# Patient Record
Sex: Male | Born: 2000 | Race: White | Hispanic: No | Marital: Single | Smoking: Never smoker
Health system: Southern US, Community
[De-identification: ages and names within clinical notes are randomized; demographics above are authoritative.]

## PROBLEM LIST (undated history)

## (undated) DIAGNOSIS — T7840XA Allergy, unspecified, initial encounter: Secondary | ICD-10-CM

## (undated) HISTORY — DX: Allergy, unspecified, initial encounter: T78.40XA

---
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
## 2021-04-22 ENCOUNTER — Other Ambulatory Visit: Payer: Self-pay

## 2021-04-22 ENCOUNTER — Ambulatory Visit
Admission: RE | Admit: 2021-04-22 | Discharge: 2021-04-22 | Disposition: A | Discharge status: Home / Self Care | Payer: Medicaid Other | Source: Ambulatory Visit | Attending: Emergency Medicine | Admitting: Emergency Medicine

## 2021-04-22 VITALS — BP 120/78 | HR 81 | Temp 98.7°F | Resp 17

## 2021-04-22 DIAGNOSIS — R6889 Other general symptoms and signs: Secondary | ICD-10-CM | POA: Diagnosis not present

## 2021-04-22 DIAGNOSIS — J111 Influenza due to unidentified influenza virus with other respiratory manifestations: Secondary | ICD-10-CM | POA: Diagnosis not present

## 2021-04-22 MED ORDER — FLUTICASONE PROPIONATE 50 MCG/ACT NA SUSP: 2.0000 | Freq: Every day | NASAL | 0 refills | Status: AC

## 2021-04-22 MED ORDER — BENZONATATE 100 MG PO CAPS: 100.0000 mg | ORAL_CAPSULE | Freq: Three times a day (TID) | ORAL | 0 refills | Status: AC

## 2021-04-22 MED ORDER — CETIRIZINE-PSEUDOEPHEDRINE ER 5-120 MG PO TB12: 1.0000 | ORAL_TABLET | Freq: Every day | ORAL | 0 refills | Status: AC

## 2021-04-22 NOTE — ED Provider Notes (Signed)
[facility]   [id number] 04/22/21 Arrival Time: 1316   CC: COVID symptoms  SUBJECTIVE: History from: patient.  Kevin Graves is a 20 y.o. male who presents with runny nose, congestion, sore throat, cough, chills, body aches, fatigue x 4 days.  Denies sick exposure to COVID, flu or strep.  Has tried OTC medications without relief.  Denies aggravating factors.  Reports previous symptoms in the past.   Denies fever, SOB, wheezing, chest pain, nausea, changes in bowel or bladder habits.    ROS: As per HPI.  All other pertinent ROS negative.     Past Medical History:  Diagnosis Date  . Allergy    No past surgical history on file. No Known Allergies No current facility-administered medications on file prior to encounter.   Current Outpatient Medications on File Prior to Encounter  Medication Sig Dispense Refill  . Clindamycin-Benzoyl Per, Refr, (DUAC) gel Apply 1 application topically 2 (two) times daily. 45 g 3  . [DISCONTINUED] cetirizine (ZYRTEC) 10 MG tablet Take 1 tablet (10 mg total) by mouth daily. (Patient not taking: Reported on 01/26/2018) 30 tablet 11   Social History   Socioeconomic History  . Marital status: Single    Spouse name: Not on file  . Number of children: Not on file  . Years of education: Not on file  . Highest education level: Not on file  Occupational History  . Not on file  Tobacco Use  . Smoking status: Never Smoker  . Smokeless tobacco: Never Used  Substance and Sexual Activity  . Alcohol use: Not on file  . Drug use: Not on file  . Sexual activity: Not on file  Other Topics Concern  . Not on file  Social History Narrative   Lives with both parents. Siblings      Rising 10th grade    Social Determinants of Health   Financial Resource Strain: Not on file  Food Insecurity: Not on file  Transportation Needs: Not on file  Physical Activity: Not on file  Stress: Not on file  Social Connections: Not on file  Intimate Partner  Violence: Not on file   No family history on file.  OBJECTIVE:  Vitals:   04/22/21 1336  BP: 120/78  Pulse: 81  Resp: 17  Temp: 98.7 F (37.1 C)  TempSrc: Tympanic  SpO2: 98%     General appearance: alert; appears fatigued, but nontoxic; speaking in full sentences and tolerating own secretions HEENT: NCAT; Ears: EACs clear, TMs pearly gray; Eyes: PERRL.  EOM grossly intact. Sinuses: nontender; Nose: nares patent without rhinorrhea, Throat: oropharynx clear, tonsils non erythematous or enlarged, uvula midline  Neck: supple without LAD Lungs: unlabored respirations, symmetrical air entry; cough: mild; no respiratory distress; CTAB Heart: regular rate and rhythm.  Radial pulses 2+ symmetrical bilaterally Skin: warm and dry Psychological: alert and cooperative; normal mood and affect  ASSESSMENT & PLAN:  1. Flu-like symptoms     Meds ordered this encounter  Medications  . cetirizine-pseudoephedrine (ZYRTEC-D) 5-120 MG tablet    Sig: Take 1 tablet by mouth daily.    Dispense:  30 tablet    Refill:  0    Order Specific Question:   Supervising Provider    Answer:   Eustace Moore [[id number]]  . fluticasone (FLONASE) 50 MCG/ACT nasal spray    Sig: Place 2 sprays into both nostrils daily.    Dispense:  16 g    Refill:  0    Order Specific Question:  Supervising Provider    Answer:   Eustace Moore [[id number]]  . benzonatate (TESSALON) 100 MG capsule    Sig: Take 1 capsule (100 mg total) by mouth every 8 (eight) hours.    Dispense:  21 capsule    Refill:  0    Order Specific Question:   Supervising Provider    Answer:   Eustace Moore [[id number]]   Covid flu testing done in office.  Someone will contact you with abnormal results Get plenty of rest and push fluids Zyrtec d and flonase prescribed Tessalon prescribed for cough Use medications daily for symptom relief Use OTC medications like ibuprofen or tylenol as needed fever or pain Call or go to the ED if you  have any new or worsening symptoms such as fever, cough, shortness of breath, chest tightness, chest pain, turning blue, changes in mental status, etc...   Reviewed expectations re: course of current medical issues. Questions answered. Outlined signs and symptoms indicating need for more acute intervention. Patient verbalized understanding. After Visit Summary given.         Rennis Harding, PA-C 04/22/21 1354

## 2021-04-22 NOTE — ED Triage Notes (Signed)
Pt presents with chills , body aches and fatigue since Sunday

## 2021-04-22 NOTE — Discharge Instructions (Addendum)
Covid flu testing done in office.  Someone will contact you with abnormal results Get plenty of rest and push fluids Use OTC zyrtec for nasal congestion, runny nose, and/or sore throat Use OTC flonase for nasal congestion and runny nose Use medications daily for symptom relief Use OTC medications like ibuprofen or tylenol as needed fever or pain Call or go to the ED if you have any new or worsening symptoms such as fever, cough, shortness of breath, chest tightness, chest pain, turning blue, changes in mental status, etc..Marland Kitchen

## 2021-04-23 LAB — COVID-19, FLU A+B NAA
Influenza A, NAA: NOT DETECTED
Influenza B, NAA: NOT DETECTED
SARS-CoV-2, NAA: NOT DETECTED

## 2021-06-28 ENCOUNTER — Encounter: Payer: Self-pay | Admitting: Pediatrics

## 2021-07-29 ENCOUNTER — Telehealth: Payer: Self-pay

## 2021-07-29 NOTE — Telephone Encounter (Signed)
Transition Care Management Follow-up Telephone Call Date of discharge and from where: 07/28/2021-[facility] How have you been since you were released from the hospital? Patient stated he is doing ok.  Any questions or concerns? No  Items Reviewed: Did the pt receive and understand the discharge instructions provided? Yes  Medications obtained and verified? Yes  Other? No  Any new allergies since your discharge? No  Dietary orders reviewed? N/A Do you have support at home? Yes   Home Care and Equipment/Supplies: Were home health services ordered? not applicable If so, what is the name of the agency? N/A  Has the agency set up a time to come to the patient's home? not applicable Were any new equipment or medical supplies ordered?  No What is the name of the medical supply agency? N/A Were you able to get the supplies/equipment? not applicable Do you have any questions related to the use of the equipment or supplies? No  Functional Questionnaire: (I = Independent and D = Dependent) ADLs: I  Bathing/Dressing- I  Meal Prep- I  Eating- I  Maintaining continence- I  Transferring/Ambulation- I  Managing Meds- I  Follow up appointments reviewed:  PCP Hospital f/u appt confirmed? No  Specialist Hospital f/u appt confirmed? No   Are transportation arrangements needed? No  If their condition worsens, is the pt aware to call PCP or go to the Emergency Dept.? Yes Was the patient provided with contact information for the PCP's office or ED? Yes Was to pt encouraged to call back with questions or concerns? Yes

## 2021-08-18 ENCOUNTER — Ambulatory Visit: Payer: Self-pay

## 2023-09-04 DIAGNOSIS — Z681 Body mass index (BMI) 19 or less, adult: Secondary | ICD-10-CM | POA: Diagnosis not present

## 2023-09-04 DIAGNOSIS — R112 Nausea with vomiting, unspecified: Secondary | ICD-10-CM | POA: Diagnosis not present

## 2023-09-04 DIAGNOSIS — R030 Elevated blood-pressure reading, without diagnosis of hypertension: Secondary | ICD-10-CM | POA: Diagnosis not present

## 2023-09-04 DIAGNOSIS — B349 Viral infection, unspecified: Secondary | ICD-10-CM | POA: Diagnosis not present

## 2023-10-23 DIAGNOSIS — B9789 Other viral agents as the cause of diseases classified elsewhere: Secondary | ICD-10-CM | POA: Diagnosis not present

## 2023-10-23 DIAGNOSIS — Z20822 Contact with and (suspected) exposure to covid-19: Secondary | ICD-10-CM | POA: Diagnosis not present

## 2023-10-23 DIAGNOSIS — J019 Acute sinusitis, unspecified: Secondary | ICD-10-CM | POA: Diagnosis not present

## 2023-10-23 DIAGNOSIS — R0989 Other specified symptoms and signs involving the circulatory and respiratory systems: Secondary | ICD-10-CM | POA: Diagnosis not present

## 2024-01-18 DIAGNOSIS — A084 Viral intestinal infection, unspecified: Secondary | ICD-10-CM | POA: Diagnosis not present

## 2024-01-30 DIAGNOSIS — A084 Viral intestinal infection, unspecified: Secondary | ICD-10-CM | POA: Diagnosis not present

## 2024-01-30 DIAGNOSIS — R1110 Vomiting, unspecified: Secondary | ICD-10-CM | POA: Diagnosis not present

## 2024-04-16 DIAGNOSIS — Z68.41 Body mass index (BMI) pediatric, less than 5th percentile for age: Secondary | ICD-10-CM | POA: Diagnosis not present

## 2024-04-16 DIAGNOSIS — R1031 Right lower quadrant pain: Secondary | ICD-10-CM | POA: Diagnosis not present

## 2024-04-19 ENCOUNTER — Other Ambulatory Visit: Payer: Self-pay | Admitting: Internal Medicine

## 2024-04-19 DIAGNOSIS — R1031 Right lower quadrant pain: Secondary | ICD-10-CM

## 2024-05-28 DIAGNOSIS — R1031 Right lower quadrant pain: Secondary | ICD-10-CM | POA: Diagnosis not present
